# Patient Record
Sex: Female | Born: 1950 | Race: White | Hispanic: No | Marital: Married | Smoking: Never smoker
Health system: Southern US, Community
[De-identification: ages and names within clinical notes are randomized; demographics above are authoritative.]

## PROBLEM LIST (undated history)

## (undated) DIAGNOSIS — F329 Major depressive disorder, single episode, unspecified: Secondary | ICD-10-CM

## (undated) DIAGNOSIS — I10 Essential (primary) hypertension: Secondary | ICD-10-CM

## (undated) DIAGNOSIS — E785 Hyperlipidemia, unspecified: Secondary | ICD-10-CM

## (undated) DIAGNOSIS — G35 Multiple sclerosis: Secondary | ICD-10-CM

## (undated) DIAGNOSIS — R32 Unspecified urinary incontinence: Secondary | ICD-10-CM

## (undated) DIAGNOSIS — F32A Depression, unspecified: Secondary | ICD-10-CM

## (undated) HISTORY — DX: Essential (primary) hypertension: I10

## (undated) HISTORY — DX: Unspecified urinary incontinence: R32

## (undated) HISTORY — DX: Depression, unspecified: F32.A

## (undated) HISTORY — DX: Major depressive disorder, single episode, unspecified: F32.9

## (undated) HISTORY — DX: Multiple sclerosis: G35

## (undated) HISTORY — DX: Hyperlipidemia, unspecified: E78.5

## (undated) HISTORY — PX: APPENDECTOMY: SHX54

---

## 1979-06-24 DIAGNOSIS — G35 Multiple sclerosis: Secondary | ICD-10-CM

## 1979-06-24 HISTORY — DX: Multiple sclerosis: G35

## 2007-07-08 ENCOUNTER — Emergency Department: Payer: Self-pay | Admitting: Internal Medicine

## 2009-06-19 ENCOUNTER — Ambulatory Visit: Payer: Self-pay | Admitting: Neurology

## 2009-12-27 ENCOUNTER — Encounter: Payer: Self-pay | Admitting: Neurology

## 2010-01-21 ENCOUNTER — Encounter: Payer: Self-pay | Admitting: Neurology

## 2010-02-07 ENCOUNTER — Ambulatory Visit: Payer: Self-pay | Admitting: Internal Medicine

## 2010-11-08 ENCOUNTER — Ambulatory Visit: Payer: Self-pay | Admitting: Gastroenterology

## 2010-12-09 ENCOUNTER — Ambulatory Visit: Payer: Self-pay | Admitting: Neurology

## 2011-12-23 ENCOUNTER — Encounter: Payer: Self-pay | Admitting: Internal Medicine

## 2011-12-23 ENCOUNTER — Ambulatory Visit (INDEPENDENT_AMBULATORY_CARE_PROVIDER_SITE_OTHER): Payer: Medicare Other | Admitting: Internal Medicine

## 2011-12-23 VITALS — BP 132/80 | HR 110 | Temp 98.2°F

## 2011-12-23 DIAGNOSIS — F32A Depression, unspecified: Secondary | ICD-10-CM | POA: Insufficient documentation

## 2011-12-23 DIAGNOSIS — F329 Major depressive disorder, single episode, unspecified: Secondary | ICD-10-CM

## 2011-12-23 DIAGNOSIS — G35 Multiple sclerosis: Secondary | ICD-10-CM | POA: Insufficient documentation

## 2011-12-23 MED ORDER — ARIPIPRAZOLE 2 MG PO TABS: 2.0000 mg | ORAL_TABLET | Freq: Every day | ORAL | Status: DC

## 2011-12-23 NOTE — Assessment & Plan Note (Signed)
Severe. Progressed to the point that patient is unable to perform any ADLs. Will set up home health evaluation and evaluation for placement. Patient will likely benefit from motorized scooter or wheelchair. Will set evaluation for this. Will get records on previous care from her primary care physician and neurologist.

## 2011-12-23 NOTE — Progress Notes (Signed)
Subjective:    Patient ID: Maria Parsons, female    DOB: [birth year], 61 y.o.   MRN: [id number]  HPI 61 year old female with history of multiple sclerosis and depression presents to establish care. She comes in with her husband today. He provides her history. He reports that she was first diagnosed with multiple sclerosis in 1981. 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 Gradually, over time her functional status has declined. 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 She needs assistance to sit. She requires 24 7 care and full assistance with all activities of daily living. She is incontinent of bowel and only partially continent of urine. Her husband has been caring for her while also working full-time. He has had caregivers in the home, but they do not currently have full-time care providers. Her husband reports that he needs assistance with her care. He also likely need some assistance with durable medical equipment such as equipment to help with transfers or with mobility such as a wheelchair. He has been considering placement in assisted care or skilled nursing facility. He brings in [facility] 2 forms in today. He reports that this is been a difficult decision for him and his children. His wife is tearful during this discussion. He reports that she avoids this discussion at home. She is unable to articulate any concerns today.  Outpatient Encounter Prescriptions as of 12/23/2011  Medication Sig Dispense Refill  . ARIPiprazole (ABILIFY) 2 MG tablet Take 1 tablet (2 mg total) by mouth daily.  90 tablet  3  . baclofen (LIORESAL) 10 MG tablet Take 10 mg by mouth 3 (three) times daily.      . enalapril (VASOTEC) 10 MG tablet Take two tablets by mouth every morning and one tablet at night      . Ergocalciferol (VITAMIN D2) 2000 UNITS TABS Take 1 tablet by mouth daily.      Marland Kitchen gabapentin (NEURONTIN) 300 MG capsule Take 300 mg by mouth 3 (three) times daily.      Marland Kitchen  loperamide (IMODIUM) 2 MG capsule Take 2 mg by mouth as needed.      . nystatin-triamcinolone (MYCOLOG II) cream Apply 1 application topically 2 (two) times daily.      . sertraline (ZOLOFT) 100 MG tablet Take 2 mg by mouth daily.      . traMADol (ULTRAM) 50 MG tablet Take 50 mg by mouth 2 (two) times daily.      Marland Kitchen DISCONTD: ARIPiprazole (ABILIFY) 2 MG tablet Take 2 mg by mouth daily.       BP 132/80  Pulse 110  Temp 98.2 F (36.8 C) (Oral)  SpO2 95%  Review of Systems  Unable to perform ROS All information obtained from husband, denies recent fever, chills, change in bowel habits. Pt is frequently tearful.     Objective:   Physical Exam  Constitutional: She is oriented to person, place, and time. She appears well-developed and well-nourished. No distress.  HENT:  Head: Normocephalic and atraumatic.  Right Ear: External ear normal.  Left Ear: External ear normal.  Nose: Nose normal.  Mouth/Throat: Oropharynx is clear and moist.  Eyes: Conjunctivae are normal. Pupils are equal, round, and reactive to light. Right eye exhibits no discharge. Left eye exhibits no discharge. No scleral icterus.  Neck: Normal range of motion. Neck supple. No tracheal deviation present. No thyromegaly present.  Cardiovascular: Normal rate, regular rhythm, normal heart sounds and intact distal pulses.  Exam reveals no  gallop and no friction rub.   No murmur heard. Pulmonary/Chest: Effort normal and breath sounds normal. No respiratory distress. She has no wheezes. She has no rales. She exhibits no tenderness.  Abdominal: Soft. Bowel sounds are normal. She exhibits no distension. There is no tenderness.  Musculoskeletal: She exhibits no edema and no tenderness.  Lymphadenopathy:    She has no cervical adenopathy.  Neurological: She is alert and oriented to person, place, and time. She displays atrophy and abnormal reflex. A sensory deficit is present. She exhibits abnormal muscle tone (flacidd LE).  Coordination and gait abnormal.  Skin: Skin is warm and dry. No rash noted. She is not diaphoretic. No erythema. No pallor.  Psychiatric: Her behavior is normal. Judgment and thought content normal. Her speech is slurred. She exhibits a depressed mood.          Assessment & Plan:

## 2011-12-23 NOTE — Assessment & Plan Note (Signed)
Patient is tearful today, however has been reports symptoms have been fairly well-controlled on Zoloft and Abilify. We'll continue these medications.

## 2011-12-25 ENCOUNTER — Encounter: Payer: Self-pay | Admitting: Internal Medicine

## 2011-12-26 ENCOUNTER — Other Ambulatory Visit: Payer: Self-pay | Admitting: *Deleted

## 2011-12-26 ENCOUNTER — Encounter: Payer: Self-pay | Admitting: Internal Medicine

## 2011-12-26 DIAGNOSIS — F329 Major depressive disorder, single episode, unspecified: Secondary | ICD-10-CM

## 2011-12-26 MED ORDER — ARIPIPRAZOLE 2 MG PO TABS: 2.0000 mg | ORAL_TABLET | Freq: Every day | ORAL | Status: DC

## 2011-12-26 MED ORDER — SERTRALINE HCL 100 MG PO TABS: 2.0000 mg | ORAL_TABLET | Freq: Every day | ORAL | Status: DC

## 2011-12-27 MED ORDER — TRAMADOL HCL 50 MG PO TABS: 50.0000 mg | ORAL_TABLET | Freq: Two times a day (BID) | ORAL | Status: AC

## 2011-12-27 MED ORDER — SERTRALINE HCL 100 MG PO TABS: 200.0000 mg | ORAL_TABLET | Freq: Every morning | ORAL | Status: AC

## 2011-12-27 MED ORDER — ARIPIPRAZOLE 2 MG PO TABS: 2.0000 mg | ORAL_TABLET | Freq: Every day | ORAL | Status: DC

## 2011-12-29 NOTE — Telephone Encounter (Signed)
Rx(s) were sent to pharmacy electronically. 

## 2011-12-30 ENCOUNTER — Telehealth: Payer: Self-pay | Admitting: *Deleted

## 2011-12-30 NOTE — Telephone Encounter (Signed)
Cari called to let Dr. Dan Humphreys know that Mrs. Boese has insurance that is out of network with [employer].  She recommended [facility].

## 2011-12-30 NOTE — Telephone Encounter (Signed)
Called and spoke with receptionist at [employer] [facility] and was advised that it doesn't sound like insurance will cover the services that she needs.  They will only cover short term services.  She stated that since patients spouse works full time then insurance will likely not cover services.  Please advise.

## 2011-12-30 NOTE — Telephone Encounter (Signed)
I would let her husband know and see if he wants [place] to assist with placement. I had filled out forms, but not sure if there are other things we can do to help.

## 2011-12-30 NOTE — Telephone Encounter (Signed)
Fine to set up [facility]

## 2011-12-31 NOTE — Telephone Encounter (Signed)
Left message on cell phone voicemail for Maria Parsons to return call.

## 2012-01-01 ENCOUNTER — Other Ambulatory Visit: Payer: Self-pay | Admitting: *Deleted

## 2012-01-01 MED ORDER — BACLOFEN 10 MG PO TABS: 10.0000 mg | ORAL_TABLET | Freq: Three times a day (TID) | ORAL | Status: DC

## 2012-01-01 NOTE — Telephone Encounter (Addendum)
Advised patients spouse via message left on machine at home that Rx was sent to pharmacy.

## 2012-01-01 NOTE — Telephone Encounter (Signed)
Left message on machine at home advising spouse as instructed, asked him to call back if we could be of any further assistance.

## 2012-01-05 ENCOUNTER — Other Ambulatory Visit: Payer: Self-pay | Admitting: *Deleted

## 2012-01-05 MED ORDER — NYSTATIN-TRIAMCINOLONE 100000-0.1 UNIT/GM-% EX CREA: 1.0000 "application " | TOPICAL_CREAM | Freq: Two times a day (BID) | CUTANEOUS | Status: DC

## 2012-01-07 ENCOUNTER — Telehealth: Payer: Self-pay | Admitting: *Deleted

## 2012-01-07 NOTE — Telephone Encounter (Signed)
Patients spouse Gery Pray) called to let Dr. Dan Humphreys know that social services changed computer systems and will need her to fill out a new FL2 form.  They will mail it to Dr. Dan Humphreys here at the office.

## 2012-01-19 ENCOUNTER — Other Ambulatory Visit: Payer: Self-pay | Admitting: *Deleted

## 2012-01-19 MED ORDER — ENALAPRIL MALEATE 10 MG PO TABS: ORAL_TABLET | ORAL | Status: AC

## 2012-01-26 ENCOUNTER — Other Ambulatory Visit: Payer: Self-pay | Admitting: *Deleted

## 2012-01-26 DIAGNOSIS — F329 Major depressive disorder, single episode, unspecified: Secondary | ICD-10-CM

## 2012-01-26 MED ORDER — ARIPIPRAZOLE 2 MG PO TABS: 2.0000 mg | ORAL_TABLET | Freq: Every day | ORAL | Status: DC

## 2012-01-26 NOTE — Telephone Encounter (Signed)
Message copied by Jobie Quaker on Mon Jan 26, 2012  2:55 PM ------      Message from: Crissie Figures K      Created: Mon Jan 26, 2012  2:30 PM       Lonia Skinner calling [phone] Calling to speak to Rn about getting refill on wife Medication abilify

## 2012-01-28 ENCOUNTER — Ambulatory Visit: Payer: Medicare Other | Admitting: Internal Medicine

## 2012-02-02 ENCOUNTER — Telehealth: Payer: Self-pay | Admitting: *Deleted

## 2012-02-02 ENCOUNTER — Encounter: Payer: Self-pay | Admitting: Internal Medicine

## 2012-02-02 NOTE — Telephone Encounter (Signed)
Fine to continue Vit D.  Yes, I will write a letter excusing from [employer].

## 2012-02-02 NOTE — Telephone Encounter (Signed)
Patients spouse called with several questions: 1) Patients spouse would like Dr. Dan Humphreys to write a letter excusing patient from jury duty and fax the letter to him at [phone] 2) Do you want patient to continue to take Vitamin D2?  If so she will need a refill.

## 2012-02-03 MED ORDER — GABAPENTIN 300 MG PO CAPS: 300.0000 mg | ORAL_CAPSULE | Freq: Three times a day (TID) | ORAL | Status: AC

## 2012-02-03 MED ORDER — BACLOFEN 10 MG PO TABS: 10.0000 mg | ORAL_TABLET | Freq: Three times a day (TID) | ORAL | Status: AC

## 2012-02-03 MED ORDER — VITAMIN D2 50 MCG (2000 UT) PO TABS: 1.0000 | ORAL_TABLET | Freq: Every day | ORAL | Status: AC

## 2012-02-03 NOTE — Telephone Encounter (Signed)
Left message on voicemail at work advising spouse that Rx's have been sent to pharmacy and letter excusing patient from jury duty was faxed to number below.

## 2012-02-17 ENCOUNTER — Encounter: Payer: Self-pay | Admitting: Internal Medicine

## 2012-02-18 ENCOUNTER — Encounter: Payer: Self-pay | Admitting: Internal Medicine

## 2012-02-18 ENCOUNTER — Ambulatory Visit (INDEPENDENT_AMBULATORY_CARE_PROVIDER_SITE_OTHER): Payer: Medicare Other | Admitting: Internal Medicine

## 2012-02-18 VITALS — BP 122/82 | HR 130 | Temp 98.7°F

## 2012-02-18 DIAGNOSIS — G35 Multiple sclerosis: Secondary | ICD-10-CM

## 2012-02-18 DIAGNOSIS — L89159 Pressure ulcer of sacral region, unspecified stage: Secondary | ICD-10-CM | POA: Insufficient documentation

## 2012-02-18 DIAGNOSIS — F329 Major depressive disorder, single episode, unspecified: Secondary | ICD-10-CM

## 2012-02-18 DIAGNOSIS — L8990 Pressure ulcer of unspecified site, unspecified stage: Secondary | ICD-10-CM

## 2012-02-18 DIAGNOSIS — L89109 Pressure ulcer of unspecified part of back, unspecified stage: Secondary | ICD-10-CM

## 2012-02-18 NOTE — Progress Notes (Signed)
Subjective:    Patient ID: Maria Parsons, female    DOB: 05-01-1951, 61 y.o.   MRN: [id number]  HPI 61 year old female with history of multiple sclerosis, depression, and chronic pain presents for followup. She has had significant decline since her last visit. Her husband reports that she is frequently slumped over in her chair and is frequently tearful. She repeatedly says that she would rather die than live this way. She refuses repositioning at home and ultimately has developed a sacral decubitus ulcer because of this. Husband has tried applying topical nystatin ointment with minimal improvement. Patient denies any pain at the site. Husband has been working with an attorney to try to establish financial way for patient to be placed in a nursing facility. At her last visit, we had tried to set up home health assistance however home health agency was out of network. Husband states that he would like to try another company at this time.  Outpatient Encounter Prescriptions as of 02/18/2012  Medication Sig Dispense Refill  . ARIPiprazole (ABILIFY) 2 MG tablet Take 1 tablet (2 mg total) by mouth daily.  30 tablet  5  . baclofen (LIORESAL) 10 MG tablet Take 1 tablet (10 mg total) by mouth 3 (three) times daily.  270 each  3  . Dimethyl Fumarate (TECFIDERA) 240 MG CPDR Take 1 tablet by mouth 2 (two) times daily.      . enalapril (VASOTEC) 10 MG tablet Take two tablets by mouth every morning and one tablet at night  90 tablet  6  . Ergocalciferol (VITAMIN D2) 2000 UNITS TABS Take 1 tablet by mouth daily.  90 tablet  3  . gabapentin (NEURONTIN) 300 MG capsule Take 1 capsule (300 mg total) by mouth 3 (three) times daily.  270 capsule  3  . loperamide (IMODIUM) 2 MG capsule Take 2 mg by mouth as needed.      . nystatin-triamcinolone (MYCOLOG II) cream Apply 1 application topically 2 (two) times daily.  30 g  6  . sertraline (ZOLOFT) 100 MG tablet Take 2 tablets (200 mg total) by mouth every morning.  60  tablet  5  . traMADol (ULTRAM) 50 MG tablet Take 1 tablet (50 mg total) by mouth 2 (two) times daily.  60 tablet  5   BP 122/82  Pulse 130  Temp 98.7 F (37.1 C) (Oral)  SpO2 91%  Review of Systems  Constitutional: Negative for fever, chills, appetite change and fatigue.  Respiratory: Negative for cough and shortness of breath.   Gastrointestinal: Negative for abdominal pain.  Musculoskeletal: Positive for myalgias, arthralgias and gait problem.  Skin: Positive for wound.  Hematological: Negative for adenopathy. Does not bruise/bleed easily.  Psychiatric/Behavioral: Positive for suicidal ideas and dysphoric mood. Negative for disturbed wake/sleep cycle. The patient is not nervous/anxious.        Objective:   Physical Exam  Constitutional: She appears well-developed and well-nourished. She has a sickly appearance. No distress.       Patient slumped over in wheelchair, leaning forward, unable to sit upright. Patient crying throughout exam. Denies pain.  HENT:  Head: Normocephalic and atraumatic.  Cardiovascular: Normal rate, regular rhythm, normal heart sounds and intact distal pulses.  Exam reveals no gallop and no friction rub.   No murmur heard. Pulmonary/Chest: Effort normal and breath sounds normal. No respiratory distress. She has no wheezes. She has no rales.  Neurological: She is alert. She exhibits abnormal muscle tone. Coordination abnormal.  Skin: Skin is warm. She is  not diaphoretic.  Psychiatric: Her speech is slurred. She is withdrawn. She exhibits a depressed mood. She expresses suicidal ideation.          Assessment & Plan:

## 2012-02-18 NOTE — Assessment & Plan Note (Signed)
Marked progression since last visit. Patient would likely benefit from round-the-clock skilled care. Discussed this with her husband today. Will make a repeat referral to home health for further assistance. We also discussed the potential of admitting patient to the hospital for help with ongoing medical issues and ultimately with placement. Husband would prefer to hold off on this at this time.

## 2012-02-18 NOTE — Assessment & Plan Note (Signed)
Patient with reported sacral decubitus ulcer. We are not able to reposition her in clinic to visualize this because of patient discomfort with movement and lack of proper equipment. Will set up evaluation at the [facility] healing Center. As above, patient would benefit from round-the-clock care with frequent repositioning to help prevent wounds. Encouraged her husband to consider inpatient stay and/or placement in nursing facility.

## 2012-02-18 NOTE — Assessment & Plan Note (Addendum)
Patient is tearful today with suicidal ideation. Discussed with husband potential of increasing dose of Zoloft and Abilify. However, he has not seen significant change with use of these medications. Will leave medicines as they are for now. Will try to set up social work evaluation in the home. Pt has 24/7 caregiver for monitoring.

## 2012-02-20 ENCOUNTER — Telehealth: Payer: Self-pay | Admitting: *Deleted
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
## 2012-02-24 NOTE — Telephone Encounter (Signed)
Left detailed message on voicemail for Maria Parsons advising as instructed as stated below.

## 2012-02-25 ENCOUNTER — Encounter: Payer: Self-pay | Admitting: Cardiothoracic Surgery

## 2012-02-25 ENCOUNTER — Encounter: Payer: Self-pay | Admitting: Nurse Practitioner

## 2012-03-02 ENCOUNTER — Other Ambulatory Visit: Payer: Self-pay | Admitting: *Deleted

## 2012-03-02 DIAGNOSIS — F329 Major depressive disorder, single episode, unspecified: Secondary | ICD-10-CM

## 2012-03-02 MED ORDER — NYSTATIN-TRIAMCINOLONE 100000-0.1 UNIT/GM-% EX CREA: 1.0000 "application " | TOPICAL_CREAM | Freq: Two times a day (BID) | CUTANEOUS | Status: AC

## 2012-03-02 MED ORDER — ARIPIPRAZOLE 2 MG PO TABS: 2.0000 mg | ORAL_TABLET | Freq: Every day | ORAL | Status: AC

## 2012-03-09 ENCOUNTER — Telehealth: Payer: Self-pay | Admitting: *Deleted

## 2012-03-09 MED ORDER — CEPHALEXIN 500 MG PO TABS: 500.0000 mg | ORAL_TABLET | Freq: Four times a day (QID) | ORAL | Status: AC

## 2012-03-09 NOTE — Telephone Encounter (Signed)
Plsae ask the hospice nurse to obtain a culture of the wound if possible.  I will call in keflex to her pharmacy .  The patient sounds like she may be septic or dehydrated.  Is shea Full Code or is she  DNR? If she is Full code  they may need to take her to the ER

## 2012-03-09 NOTE — Telephone Encounter (Signed)
I spoke with Maria Parsons, she will obtain the culture tomorrow.  I advised of the Rx, and she stated patient is DNR.

## 2012-03-09 NOTE — Telephone Encounter (Signed)
Marchelle Folks w/Hospice reporting that patient is being treated for MS & Stage IV decubitus ulcer and upon her visit today pt has fever of 99.6 w/decreased level of consciousness [able to move head to answer], elevated HR [159] & BP [110/64]; reports wound appears to be worse in appearance, drainage & odor since being changed yesterday/SLS Request for for ABX Rx, please advise.

## 2012-03-12 ENCOUNTER — Telehealth: Payer: Self-pay | Admitting: Internal Medicine

## 2012-03-12 NOTE — Telephone Encounter (Signed)
Urine culture showed E. Coli. Has this been treated?

## 2012-03-12 NOTE — Telephone Encounter (Signed)
Spoke with patients spouse Gery Pray and he stated that something was sent in for her but he wasn't sure if it was for the urine bacteria.  I called [facility] and got the after hours answering service and Dr. Dan Humphreys advised that I wait and call back on Monday.

## 2012-03-13 ENCOUNTER — Encounter: Payer: Self-pay | Admitting: Internal Medicine

## 2012-03-15 NOTE — Telephone Encounter (Signed)
Wound culture results given to Dr. Dan Humphreys.

## 2012-03-15 NOTE — Telephone Encounter (Signed)
Culture shows bacteria that should be sensitive to Keflex.

## 2012-03-15 NOTE — Telephone Encounter (Signed)
Patients spouse advised as instructed via telephone. 

## 2012-03-17 ENCOUNTER — Inpatient Hospital Stay: Payer: Self-pay | Admitting: Internal Medicine

## 2012-03-17 LAB — CBC
HCT: 27 % — ABNORMAL LOW (ref 35.0–47.0)
HGB: 8.8 g/dL — ABNORMAL LOW (ref 12.0–16.0)
Platelet: 412 10*3/uL (ref 150–440)
RBC: 3.17 10*6/uL — ABNORMAL LOW (ref 3.80–5.20)
WBC: 11.3 10*3/uL — ABNORMAL HIGH (ref 3.6–11.0)

## 2012-03-17 LAB — FERRITIN: Ferritin (ARMC): 219 ng/mL (ref 8–388)

## 2012-03-17 LAB — BASIC METABOLIC PANEL
Anion Gap: 12 (ref 7–16)
BUN: 26 mg/dL — ABNORMAL HIGH (ref 7–18)
Chloride: 99 mmol/L (ref 98–107)
Creatinine: 0.33 mg/dL — ABNORMAL LOW (ref 0.60–1.30)
EGFR (Non-African Amer.): >60
Potassium: 4.9 mmol/L (ref 3.5–5.1)
Sodium: 132 mmol/L — ABNORMAL LOW (ref 136–145)

## 2012-03-17 LAB — IRON AND TIBC
Iron Bind.Cap.(Total): 239 ug/dL — ABNORMAL LOW (ref 250–450)
Unbound Iron-Bind.Cap.: 209 ug/dL

## 2012-03-17 LAB — TROPONIN I
Troponin-I: <0.02 ng/mL
Troponin-I: <0.02 ng/mL

## 2012-03-17 LAB — URINALYSIS, COMPLETE
Bilirubin,UR: NEGATIVE
Ketone: NEGATIVE
Nitrite: NEGATIVE
Ph: 7 (ref 4.5–8.0)
RBC,UR: 24 /HPF (ref 0–5)
Specific Gravity: 1.02 (ref 1.003–1.030)
Squamous Epithelial: 1

## 2012-03-17 LAB — CK-MB: CK-MB: 2.2 ng/mL (ref 0.5–3.6)

## 2012-03-18 ENCOUNTER — Ambulatory Visit: Payer: Self-pay | Admitting: Hematology and Oncology

## 2012-03-18 LAB — CBC WITH DIFFERENTIAL/PLATELET
Basophil #: 0 10*3/uL (ref 0.0–0.1)
Basophil %: 0.4 %
Eosinophil %: 7.1 %
HGB: 7.6 g/dL — ABNORMAL LOW (ref 12.0–16.0)
Lymphocyte #: 0.9 10*3/uL — ABNORMAL LOW (ref 1.0–3.6)
Lymphocyte %: 13.1 %
MCHC: 33.6 g/dL (ref 32.0–36.0)
MCV: 85 fL (ref 80–100)
Monocyte %: 5.7 %
Neutrophil #: 4.9 10*3/uL (ref 1.4–6.5)
Neutrophil %: 73.7 %
RBC: 2.65 10*6/uL — ABNORMAL LOW (ref 3.80–5.20)
RDW: 15.5 % — ABNORMAL HIGH (ref 11.5–14.5)
WBC: 6.7 10*3/uL (ref 3.6–11.0)

## 2012-03-18 LAB — COMPREHENSIVE METABOLIC PANEL
Alkaline Phosphatase: 81 U/L (ref 50–136)
Bilirubin,Total: 0.2 mg/dL (ref 0.2–1.0)
Chloride: 104 mmol/L (ref 98–107)
Co2: 23 mmol/L (ref 21–32)
Creatinine: 0.36 mg/dL — ABNORMAL LOW (ref 0.60–1.30)
EGFR (African American): >60
EGFR (Non-African Amer.): >60
Osmolality: 273 (ref 275–301)
Sodium: 137 mmol/L (ref 136–145)

## 2012-03-18 LAB — URINE CULTURE

## 2012-03-19 LAB — HEMOGLOBIN: HGB: 7.5 g/dL — ABNORMAL LOW (ref 12.0–16.0)

## 2012-03-20 LAB — VANCOMYCIN, TROUGH: Vancomycin, Trough: 10 ug/mL (ref 10–20)

## 2012-03-21 LAB — CBC WITH DIFFERENTIAL/PLATELET
Eosinophil #: 0.2 10*3/uL (ref 0.0–0.7)
HCT: 29.4 % — ABNORMAL LOW (ref 35.0–47.0)
Lymphocyte #: 0.8 10*3/uL — ABNORMAL LOW (ref 1.0–3.6)
MCH: 28.5 pg (ref 26.0–34.0)
MCV: 84 fL (ref 80–100)
Monocyte #: 0.4 x10 3/mm (ref 0.2–0.9)
Monocyte %: 5.4 %
Neutrophil %: 78.8 %
Platelet: 420 10*3/uL (ref 150–440)
RBC: 3.48 10*6/uL — ABNORMAL LOW (ref 3.80–5.20)
RDW: 15 % — ABNORMAL HIGH (ref 11.5–14.5)
WBC: 7.2 10*3/uL (ref 3.6–11.0)

## 2012-03-22 LAB — CREATININE, SERUM
Creatinine: 0.35 mg/dL — ABNORMAL LOW (ref 0.60–1.30)
EGFR (African American): >60

## 2012-03-22 LAB — VANCOMYCIN, TROUGH: Vancomycin, Trough: 13 ug/mL (ref 10–20)

## 2012-03-23 ENCOUNTER — Ambulatory Visit: Payer: Self-pay | Admitting: Hematology and Oncology

## 2012-03-23 LAB — BASIC METABOLIC PANEL
Calcium, Total: 8.4 mg/dL — ABNORMAL LOW (ref 8.5–10.1)
Chloride: 105 mmol/L (ref 98–107)
Co2: 25 mmol/L (ref 21–32)
Creatinine: 0.37 mg/dL — ABNORMAL LOW (ref 0.60–1.30)
Glucose: 83 mg/dL (ref 65–99)
Sodium: 143 mmol/L (ref 136–145)

## 2012-03-23 LAB — CBC WITH DIFFERENTIAL/PLATELET
Basophil %: 0.3 %
Eosinophil #: 0.1 10*3/uL (ref 0.0–0.7)
HCT: 29.5 % — ABNORMAL LOW (ref 35.0–47.0)
HGB: 9.8 g/dL — ABNORMAL LOW (ref 12.0–16.0)
Lymphocyte #: 1.5 10*3/uL (ref 1.0–3.6)
MCH: 27.9 pg (ref 26.0–34.0)
MCHC: 33.2 g/dL (ref 32.0–36.0)
MCV: 84 fL (ref 80–100)
Monocyte #: 0.4 x10 3/mm (ref 0.2–0.9)
Neutrophil #: 12.7 10*3/uL — ABNORMAL HIGH (ref 1.4–6.5)
Neutrophil %: 86.5 %
Platelet: 399 10*3/uL (ref 150–440)

## 2012-03-23 LAB — CULTURE, BLOOD (SINGLE)

## 2012-03-24 LAB — BASIC METABOLIC PANEL
Anion Gap: 9 (ref 7–16)
BUN: 7 mg/dL (ref 7–18)
Chloride: 109 mmol/L — ABNORMAL HIGH (ref 98–107)
EGFR (Non-African Amer.): >60
Glucose: 66 mg/dL (ref 65–99)
Osmolality: 279 (ref 275–301)
Potassium: 3.6 mmol/L (ref 3.5–5.1)
Sodium: 142 mmol/L (ref 136–145)

## 2012-03-31 ENCOUNTER — Encounter: Payer: Self-pay | Admitting: Internal Medicine

## 2012-04-23 ENCOUNTER — Ambulatory Visit: Payer: Self-pay | Admitting: Hematology and Oncology

## 2012-04-23 DEATH — deceased

## 2012-08-07 ENCOUNTER — Other Ambulatory Visit: Payer: Self-pay

## 2013-02-10 IMAGING — CT CT CHEST W/ CM
1 series · 15 of 34 positions shown, 19 images · IV contrast (APPLIED)
Comparison: none

REASON FOR EXAM: tachycardia, hypoxia. normal chest xray
COMMENTS:

[Series 5: soft tissue · axial · 0.64mm/px · z∈[-120,+124]mm · 15 of 97 slices shown, 19 images]
[im 8/97  mediastinal]
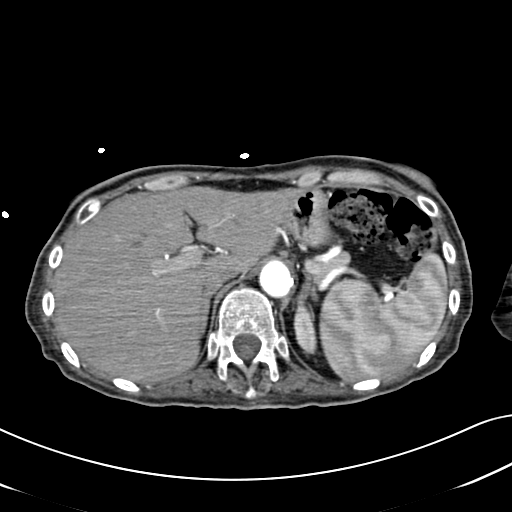
[im 8/97  lung]
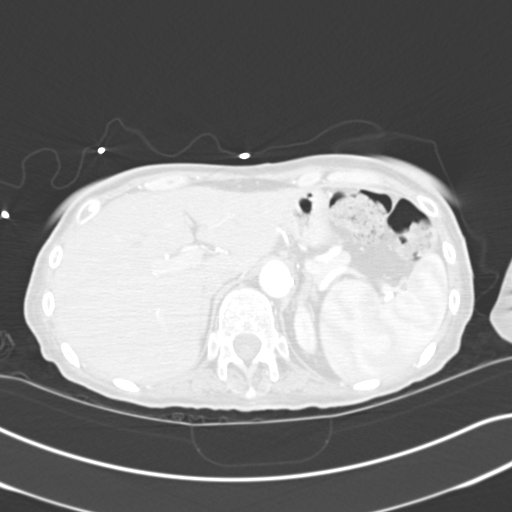
[im 15/97  lung]
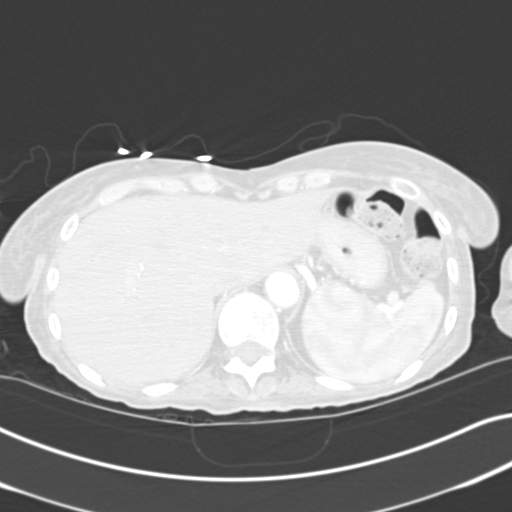
[im 20/97  lung]
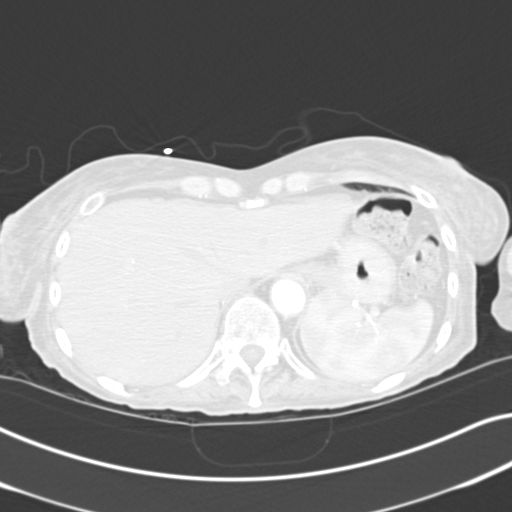
[im 25/97  lung]
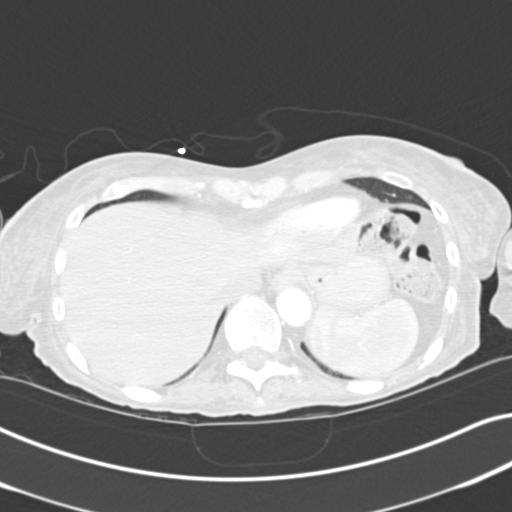
[im 33/97  mediastinal]
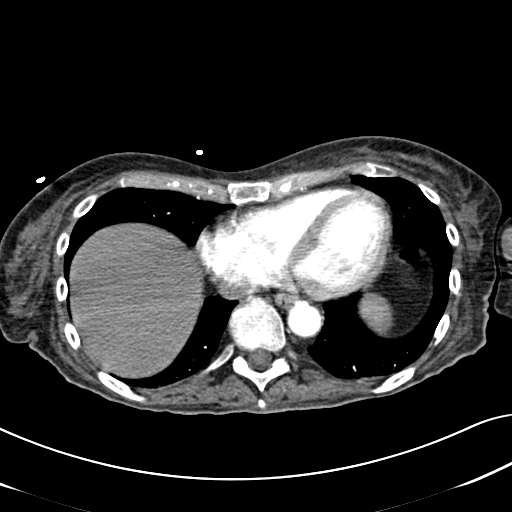
[im 33/97  lung]
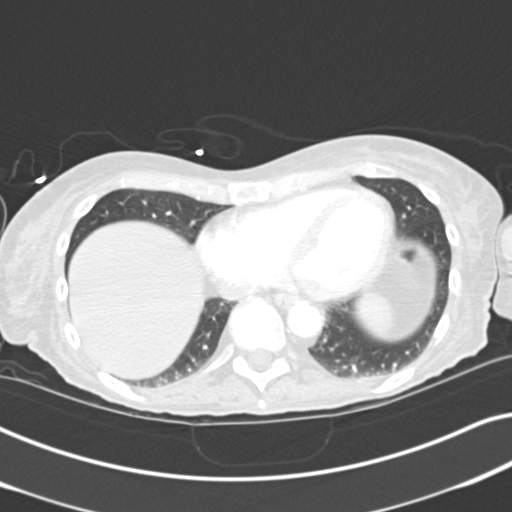
[im 39/97  lung]
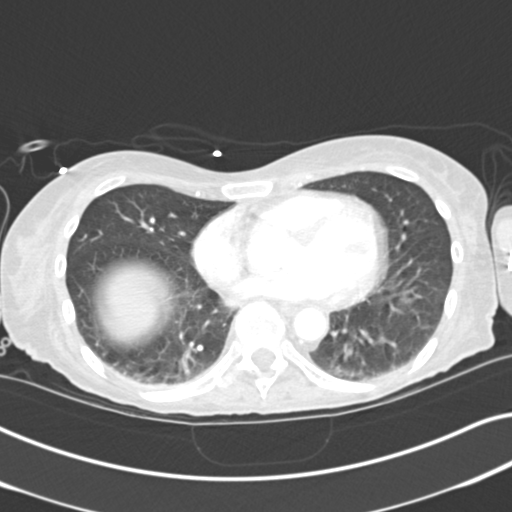
[im 43/97  lung]
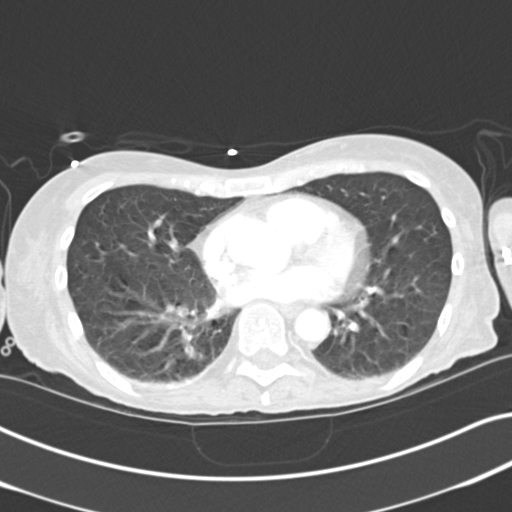
[im 50/97  lung]
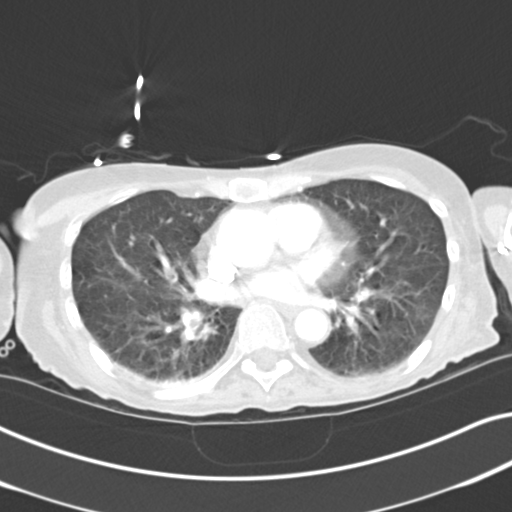
[im 54/97  mediastinal]
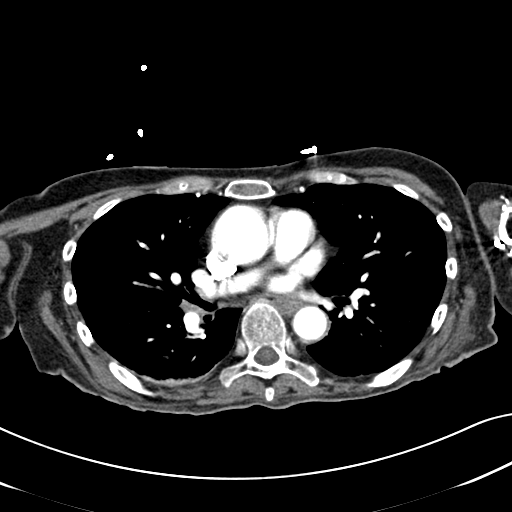
[im 54/97  lung]
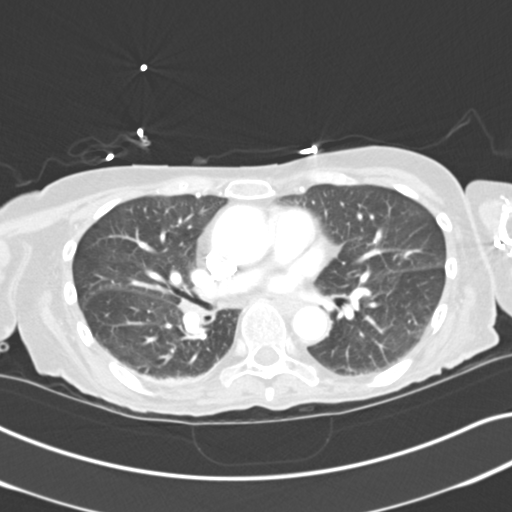
[im 58/97  lung]
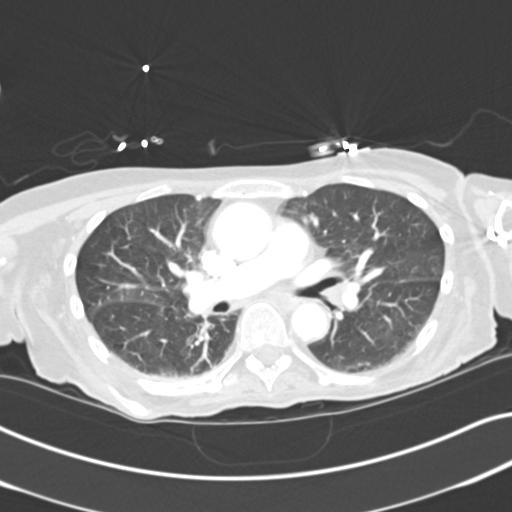
[im 65/97  lung]
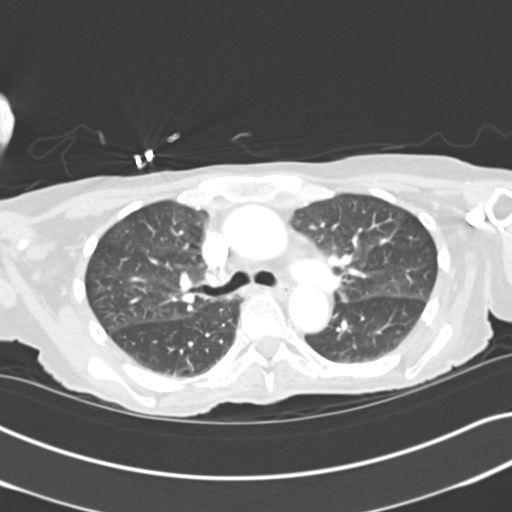
[im 72/97  lung]
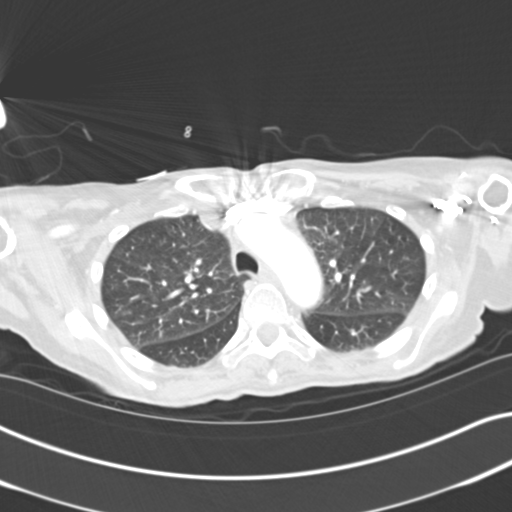
[im 77/97  mediastinal]
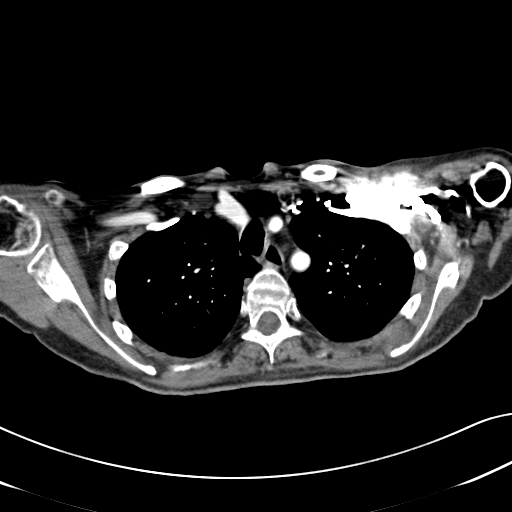
[im 77/97  lung]
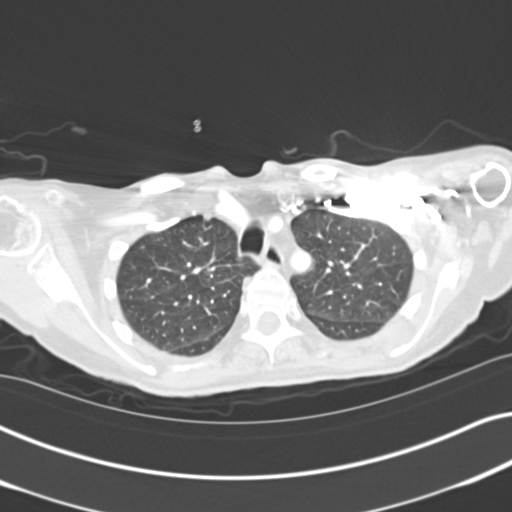
[im 82/97  lung]
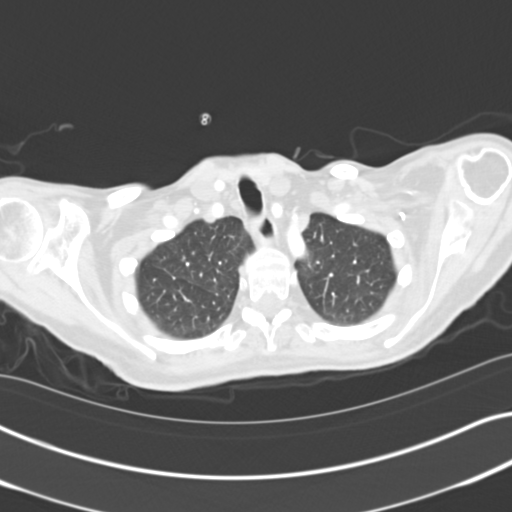
[im 89/97  lung]
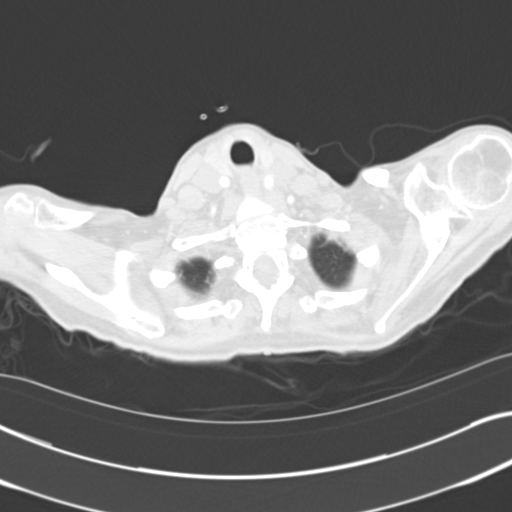

[15 of 34 positions shown; findings below may reference images not displayed]

PROCEDURE:     CT  - CT CHEST (FOR PE) W  - March 17, 2012  [DATE]

RESULT:     Chest CT is performed utilizing 75 mL of 7sovue-17C iodinated
intravenous contrast with images reconstructed at 3 mm slice thickness
utilizing a CT pulmonary angiogram technique. Multiplanar reconstructions
are evaluated with the [employer] software at the time of interpretation.
There is no similar previous study for comparison.

There is respiratory motion artifact on some of the lung window images.
There is hazy groundglass attenuation especially in the mid and lower lung
zones. Some areas of presumed of dependent atelectasis are noted
bilaterally. There is no definite bronchiectasis or definite focal
consolidation to suggest pneumonia. No definite mass is appreciated. The
thoracic aorta is normal in caliber. The heart is borderline enlarged. No
mediastinal or hilar mass or adenopathy is evident. The pulmonary arteries
show no definite filling defect. The respiratory motion artifact limits
resolution in the infrahilar region. The included thyroid lobes appear to
enhance homogeneously and symmetrically without enlargement or mass
demonstrated. Bony structures show some degenerative change.
IMPRESSION: 1. Cardiomegaly. No thoracic aortic dissection evident. There is some
limitation of the study because of artifact from motion.
2. No pulmonary embolism evident.
3. Some mild groundglass attenuation in the lungs with some respiratory
motion artifact and dependent atelectasis.
4. The included upper abdominal viscera appear to be grossly normal for the
early arterial phase of injection.

[REDACTED]

## 2013-02-16 IMAGING — CR DG CHEST 1V PORT
1 series · 1 of 1 positions shown · non-contrast
Comparison: none

REASON FOR EXAM: aspiration
COMMENTS:

[portable]
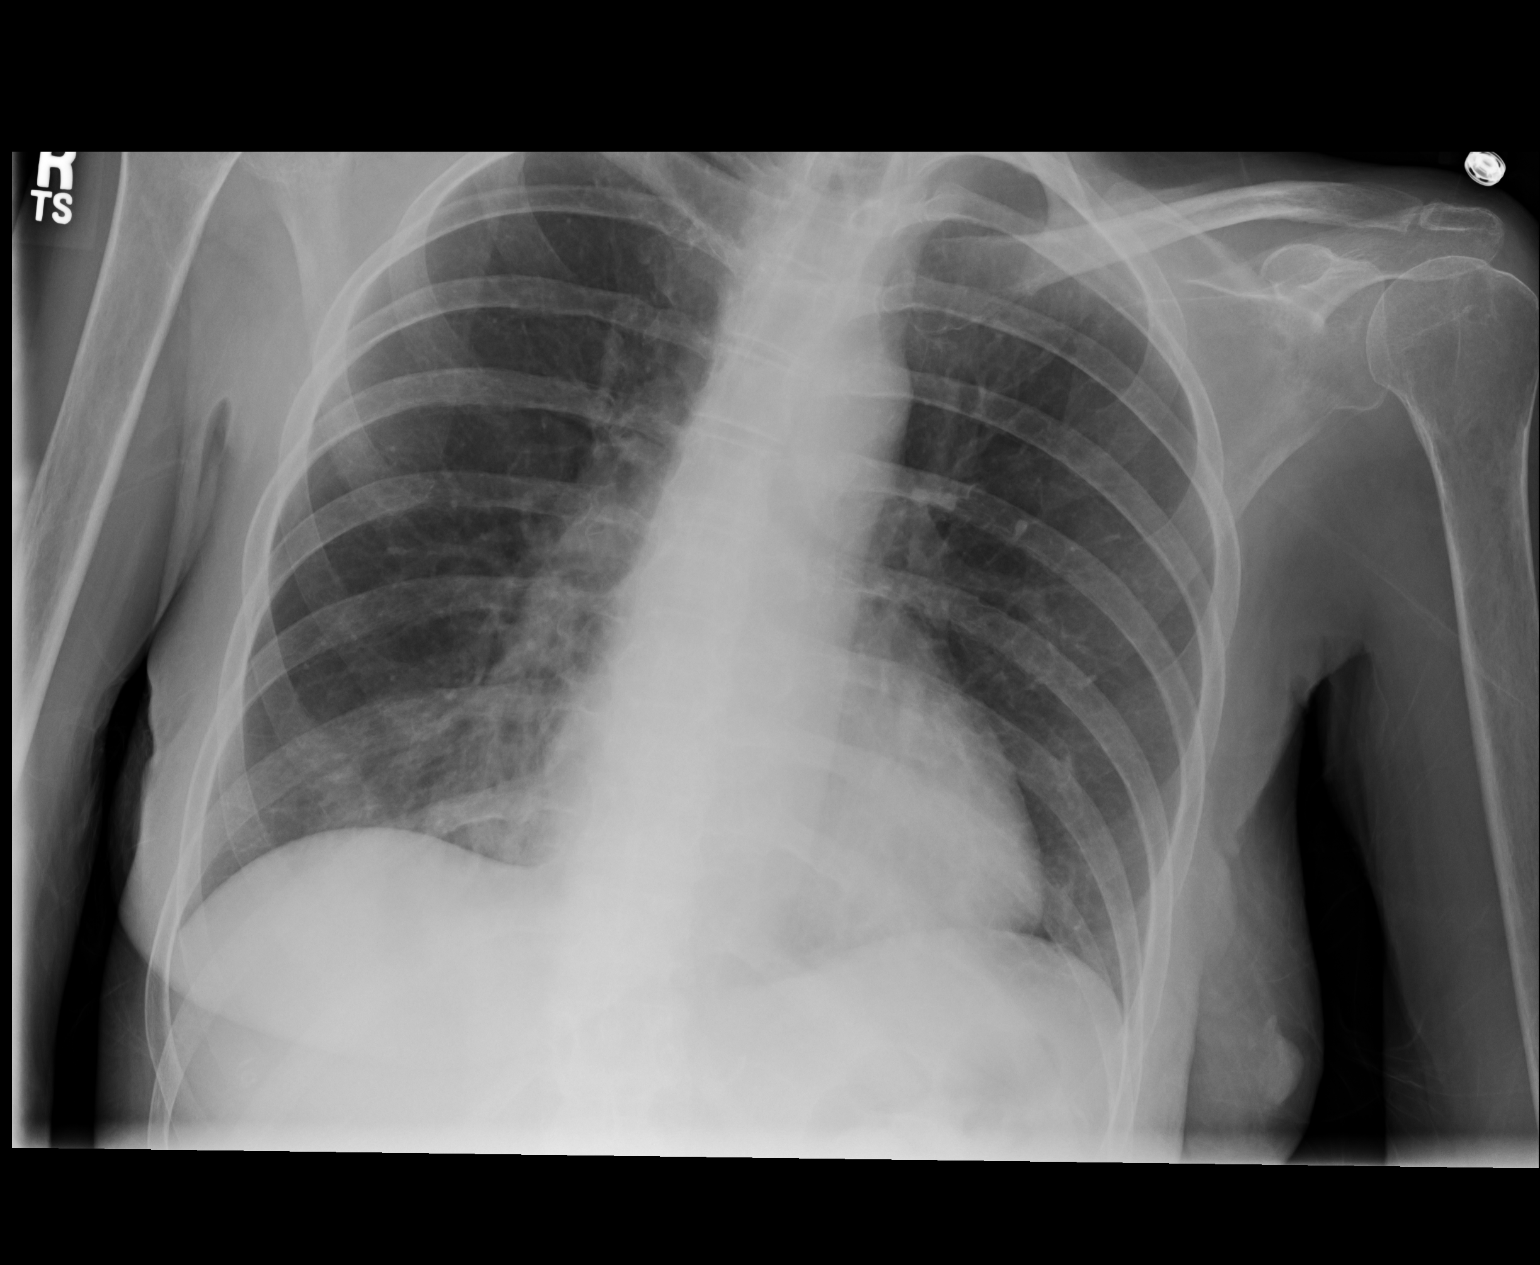

[1 of 1 positions shown; findings below may reference images not displayed]

PROCEDURE:     DXR - DXR PORTABLE CHEST SINGLE VIEW  - March 23, 2012  [DATE]

RESULT:     Comparison is made to the study March 22, 2012.

The lungs are adequately inflated. There are coarse lung markings in the
infrahilar region on the right which are more conspicuous than on
yesterday's study. The cardiac silhouette is normal in size. The pulmonary
vascularity is not engorged. There is no pleural pleural effusion.
IMPRESSION: The findings suggest developing atelectasis in the right
infrahilar region medially. When the patient can tolerate the procedure, a
PA and lateral chest x-ray would be of value.

[REDACTED]

## 2013-04-28 ENCOUNTER — Other Ambulatory Visit: Payer: Self-pay

## 2014-10-10 NOTE — Consult Note (Signed)
PATIENT NAME:  Maria Parsons, Helane MR#:  [id number] DATE OF BIRTH:  1950/09/02  DATE OF CONSULTATION:  03/18/2012  REFERRING PHYSICIAN:  Prime Doc  CONSULTING PHYSICIAN:  Carmie Endalph L. Ely III, MD  PRIMARY CARE PHYSICIAN: Ronna PolioJennifer Walker, MD  CHIEF COMPLAINT: Decubitus ulcer.   BRIEF HISTORY: Ms. Donavan FoilBass is a 64 year old woman with multiple medical problems. She has a long-standing history of progressive multiple sclerosis and has had a chronic decubitus ulcer. She has been bedbound for last several months and has [facility] some time in an effort to treat this significant sacral decubitus. The remainder of her history and current illness is well outlined in her admission note and does not pertain to the surgical question. She lives at home and has a home care provider during the day and her husband takes care of her on nights and weekends. The son is present for the interview and provides some of the history.   REVIEW OF SYSTEMS: Not possible. Admitting review of systems was reviewed.  PHYSICAL EXAMINATION:   GENERAL: She is lying in bed, uncomfortable, cooperative, with no obvious distress, but she does not communicate easily and appears frightened and unhappy with current state of affairs.   NECK: Supple without adenopathy. Trachea is midline.   CHEST: Clear with no wheezing and she appears to have respiratory insufficiency or increased work of breathing.   CARDIAC: No murmurs or gallops to my ear. She has no distal edema.   ABDOMEN: Benign. No organomegaly and she has no rebound, guarding, or abdominal tenderness. Looking at her pelvis and sacrum, she has a large 10 to 12 cm in diameter sacral ulcer down through the sacral fascia. There is no evidence of exposed bone or osteomyelitis, but there is fascia present. There is some necrotic skin, but obviously no significant infection. There is no surrounding cellulitis.   EXTREMITIES: Lower extremity exam reveals very cachectic  lower extremities with markedly diminished distal pulses.   ASSESSMENT AND RECOMMENDATIONS: I had a long talk with the son. This woman will require extensive surgical intervention in order to provide any hope of closing this ulcer. The current therapy has simply been temporizing and has not made any progress in actually closing the ulcer cavity. She would need debridement and wound VAC therapy and possible flap coverage depending on the progression with wound VAC therapy. She would need regular home health and office evaluations for wound VAC changes and possible multiple intraoperative wound vac changes. I talked to the son who will speak with his father and her husband and make a decision regarding degree of intervention. She is currently followed by hospice at home and perhaps we should continue the palliative dressing changes rather than pursue an aggressive surgical approach.  We will continue to follow this woman while she is in the hospital in an effort to adhere to the family's plan.  ____________________________ Carmie Endalph L. Ely III, MD rle:slb D: 03/18/2012 16:09:00 ET T: 03/19/2012 09:05:49 ET JOB#: [id number]  cc: Carmie Endalph L. Ely III, MD, <Dictator> Ginette PitmanJennifer A. Dan HumphreysWalker, MD Quentin OreALPH L ELY MD ELECTRONICALLY SIGNED 03/21/2012 16:26

## 2014-10-10 NOTE — Consult Note (Signed)
History of Present Illness:   Reason for Consult Anemia Consult for possible iv Iron therapy.    HPI   Ms. Maria Parsons is a 64 yo woman with multiple medical problems including advanced MS, nonverbal and bedbound at baseline, chronic stage IV sacral decubiti, HTN. Pt was sent to the ER from the wound center due to respiratory distress and "gurgling". She was admitted on 03/17/12 with respiratory failure and possible dysphagia/aspiration and SIRS likely from sacral wound. Also noted to have anemia and dehydration.  At present, pt is lying in bed, nonverbal but will nod head to questions. has recently started following patient in the home as well.    PFSH:   Family History noncontributory    Social History noncontributory   Review of Systems:   Review of Systems   Not able to obtain as patient nonverbal  NURSING NOTES: **Vital Signs.:   26-Sep-13 09:41    Vital Signs Type: Routine    Temperature Temperature (F): 97    Celsius: 36.1    Temperature Source: axillary    Pulse Pulse: 100    Respirations Respirations: 20    Systolic BP Systolic BP: 108    Diastolic BP (mmHg) Diastolic BP (mmHg): 66    Mean BP: 80    Pulse Ox % Pulse Ox %: 96    Pulse Ox Activity Level: At rest    Oxygen Delivery: Room Air/ 21 %   Physical Exam:   General Cachectic female in no acute distress    Lungs: rhonchi    Cardiac: regular rate, rhythm    Breast: not examined    Abdomen: positive bowel sounds    Skin: wound  stage IV decubitus    Neuro: nonverbal    No Known Allergies:   Assessment and Plan:  Impression:   Ms. Maria Parsons is a 64 yo woman with multiple medical problems including advanced MS, nonverbal and bedbound at baseline, chronic stage IV sacral decubiti, HTN with normocytic anemia likely multifactorial. Anemia secoandary to malnutrition,chronic inflammation as well as renal impairment.  Plan:   Would not recommend intravenous Iron infusion in this situation. If necessary  would transfuse patient.consult.contact us if any additional queries.  Electronic Signatures: Antony Hasteamiah, Jaslene Marsteller S (MD)  (Signed 30-Sep-13 10:26)  Authored: HISTORY OF PRESENT ILLNESS, PFSH, ROS, NURSING NOTES, PE, ALLERGIES, ASSESSMENT AND PLAN   Last Updated: 30-Sep-13 10:26 by Antony Hasteamiah, Germaine Shenker S (MD)

## 2014-10-10 NOTE — Consult Note (Signed)
Comments   Dr Phifer and I had a lengthy meeting with pt's husband and son. We again discussed options for continued care including probable need for PEG insertion vs comfort feedings and hospice care. Family says they have talked with patient and made the decision to pursue PEG. They want to take patient home with hospice care. Family understands that patient is approaching end of life. They are making decisions necessary to maintain needed care in the home including ensuring that patient has 24/7 caregivers. They understand that patient may need the Hospice Home in the future and that they can also utilize respite services via hospice.  discussed with CM who will contact [facility] to ensure they can facilitate wound vac and artificial nutrition in the home. Will have CM speak to family regarding details.  will discuss with attending. Will need GI consult for PEG.  45 minutes   Electronic Signatures for Addendum Section:  Phifer, Izora Gala (MD) (Signed Addendum 01-Oct-13 12:57)  Billey Chang, NP, and I met with pt's husband and son. Agree with assessment and plan as outlined in above note.   Electronic Signatures: Borders, Kirt Boys (NP)  (Signed 01-Oct-13 09:54)  Authored: Palliative Care   Last Updated: 01-Oct-13 12:57 by Phifer, Izora Gala (MD)

## 2014-10-10 NOTE — Consult Note (Signed)
    Comments   Josh Borders, NP, and I had a lengthy discussion with pt and her husband. We reviewed all options including PEG, SNF, home with hospice +/- PEG and Hospice Home. Pt was able to participate in discussion and make her wishes known. She eventually made the decision not to have a PEG and [facility] Husband is present and also in agreement. Ginny Ward, RN, [facility].  is getting prn IV morhpine. Will start fentanyl patch as pt cannot tolerate pills. Can use morphine elixir as prn pain medication [facility].   Electronic Signatures: Neylan Koroma, Harriett SineNancy (MD)  (Signed 02-Oct-13 12:18)  Authored: Palliative Care   Last Updated: 02-Oct-13 12:18 by Solana Coggin, Harriett SineNancy (MD)

## 2014-10-10 NOTE — Consult Note (Signed)
Brief Consult Note: Diagnosis: Anemia- normocytic.   Patient was seen by consultant.   Comments: Anemia- likely multifactorial.Patients has compromised nutrition and at present bedbound with decubitus ulcer/inflammation. Would not recommend intravenous iron therapy at this time. Would transfuse if Hb<7 if deemed necessary. Await palliative care consult.  Electronic Signatures: Antony Hasteamiah, Jessicamarie Amiri S (MD)  (Signed 26-Sep-13 13:37)  Authored: Brief Consult Note   Last Updated: 26-Sep-13 13:37 by Antony Hasteamiah, Markham Dumlao S (MD)

## 2014-10-10 NOTE — Consult Note (Signed)
PATIENT NAME:  Maria Parsons, Maria Parsons MR#:  [id number] DATE OF BIRTH:  [birth year]  DATE OF CONSULTATION:  03/24/2012  REFERRING PHYSICIAN:  Srikar R. Sudini, MD   CONSULTING PHYSICIAN:  Lurline DelShaukat Deisha Stull, MD  PRIMARY CARE PHYSICIAN: Ronna PolioJennifer Walker, MD   REASON FOR CONSULTATION: PEG tube placement.  HISTORY OF PRESENT ILLNESS: The patient is a 63 year old female, very frail, with multiple medical problems including hypertension, multiple sclerosis, chronic decubitus ulcers. She has been bedbound. The patient was sent to the Emergency Room from the [facility] Care Clinic after she was noticed to have some gurgling. She was hypoxic and tachycardic. Chest x-ray did not show any acute infiltrates. The patient was evaluated by Speech Therapy, and a modified dysphagia diet was recommended due to her showing clear aspiration with liquids. Apparently while on the dysphagia diet she aspirated again a couple of days ago and was made n.p.o. A chest x-ray is now showing what appears to be a right-sided infiltrate raising concerns about aspiration pneumonia. The patient became hypoxic again and tachycardic, although as of now her saturations are better and heart rate is better controlled as well. A feeding tube was requested by the Primary Care service due to recurrent aspiration. The patient was evaluated yesterday. The feeding tube was discussed with her. She is very weak and frail, although she appears to understand the procedure very well. She has not made a decision about a feeding tube yet and would like to discuss it further with her family. No other significant symptoms were reported by the patient, but again the patient is very weak and is unable to give a detailed history.   PAST MEDICAL HISTORY:  1. History of MS.  2. Hypertension. 3. Chronic decubitus ulcers.   SOCIAL HISTORY: She lives at home. It appears that the patient will be discharged to Hospice care.   FAMILY HISTORY: Not available.   HOME MEDICATIONS:  Home medications, according to the chart, include baclofen, enalapril, tramadol, vitamin E, Zoloft.   REVIEW OF SYSTEMS: Review of systems is hard to obtain because of the patient's extreme weakness and fatigue.   PHYSICAL EXAMINATION:  GENERAL: A very frail, thin, cachectic female. She appears very weak. She does not appear to be in any acute distress.   VITAL SIGNS: Temperature is 97.5, heart rate is 103, respirations 18 to 20, blood pressure 153/77. Oxygen saturation is about 98%.   LUNGS: A few scattered bilateral crackles.   CARDIOVASCULAR: Regular rate and rhythm. No gallops or murmur.   ABDOMEN: Mild diffuse abdominal tenderness without any rebound or guarding. No ascites or hepatosplenomegaly was noted. White cell count is 14,000, hemoglobin 9.8, hematocrit 29.5, platelet count of 399. Electrolytes are fairly unremarkable. Most recent chest x-ray shows haziness in the right infrahilar region which was read as possible atelectasis by the radiologist although could very well be an infiltrate.   ASSESSMENT AND PLAN: The patient is with very poor overall health, multiple sclerosis  and chronic decubitus ulcers. The patient was admitted with what appears to be aspiration with hypoxia and tachycardia. The patient aspirated in the hospital on modified dysphagia diet and, therefore, she has been made n.p.o. as she is considered to be high risk for recurrent aspiration. A PEG tube has been recommended by the Primary Care service. I had a discussion with the patient yesterday about placement of a feeding tube. She has not made a decision yet, and she would like to discuss it with her family before making any further decisions. The patient  overall appears to be frail and has just been recovering from episodes of significant hypoxia and tachycardia. She would be considered a moderately high risk for a surgical procedure such as a feeding tube placement, which carries the risk of further hypoxia, cardiac  arrhythmias, and even death. This will be discussed with the patient and her family; and depending on the patient's decision, further recommendations will be made.     Meanwhile. I would support her with IV hydration and consider  PPN or TPN can be considered as well. I discussed with Dr. Elpidio Anis and will follow.   ____________________________ Lurline Del, MD si:cbb D: 03/24/2012 09:14:38 ET T: 03/24/2012 09:53:10 ET JOB#: [id number]  cc: Lurline Del, MD, <Dictator> Lurline Del MD ELECTRONICALLY SIGNED 04/13/2012 17:20

## 2014-10-10 NOTE — Consult Note (Signed)
Chief Complaint:   Subjective/Chief Complaint Case discussed with palliative care. The patient has made a decision not to undergo PEG placement. Will sign off. Please call us if needed. Thanks.   Electronic Signatures: Lurline DelIftikhar, Gatsby Chismar (MD)  (Signed 02-Oct-13 17:30)  Authored: Chief Complaint   Last Updated: 02-Oct-13 17:30 by Lurline DelIftikhar, Alekhya Gravlin (MD)

## 2014-10-10 NOTE — H&P (Signed)
PATIENT NAME:  Maria Parsons, Maria Parsons MR#:  [id number] DATE OF BIRTH:  06-26-1950  DATE OF ADMISSION:  03/17/2012  PRIMARY CARE PHYSICIAN: Ronna PolioJennifer Walker, MD  The case was discussed with the ER physician, Dr. Carollee MassedKaminski. Old records have been reviewed. History obtained from the patient and her husband at bedside. Imaging studies of chest x-ray and EKG were reviewed personally.   CHIEF COMPLAINT: Shortness of breath.   HISTORY OF PRESENTING ILLNESS: The patient is a 64 year old female patient with history of hypertension, multiple sclerosis, and chronic decubitus ulcer who has been bedbound for the past one year presents to the emergency room sent in from the [facility] after she was noticed to have some gurgling. The patient was doing well at home. After she was noticed to have gurgling and the patient  was being brought to the emergency room, she also had shortness of breath, got hypoxic and had to be placed on oxygen for acute respiratory failure. The patient presently complains of no shortness of breath or chest pain. She complains of back pain. She had a soft bowel movement in the emergency room. She has been tachycardic up to 140s and 150s and presently down to 125 and is saturating 95% on room air at this time.   The patient has been bedridden secondary to multiple sclerosis for the past one year and has not been out of bed for the past two weeks. She does go to the Wound Clinic for her wound and was supposed to have debridement today. The patient has been in hospice for the past two weeks.   The patient is also anemic with hemoglobin of 8.8 with the last hemoglobin being 11.3 in June of 2013. She does not have any blood in his stools, hematemesis, or hematuria.   A chest x-ray shows no acute abnormalities. EKG shows sinus tachycardia with no acute ST-T wave changes.   PAST MEDICAL HISTORY:  1. Hypertension.  2. Multiple sclerosis. 3. Chronic decubitus ulcers. 4. Bedbound state. 5. Weight  loss.   SOCIAL HISTORY: The patient lives at home, has a home daycare taker and her husband takes care of her at night. No smoking, no alcohol, and no illicit drugs.   FAMILY HISTORY: Reviewed and unknown.   HOME MEDICATIONS: Baclofen, enalapril, tramadol, vitamin A, Zoloft, Tecfidera with dosing and schedule unknown at this time.   REVIEW OF SYSTEMS: CONSTITUTIONAL: Complains of fatigue, weakness, and weight loss. No weight gain. EYES: No blurred vision, discharge, or redness. ENT: No nasal congestion or tinnitus. CARDIOVASCULAR: No chest pain, palpitations, or edema. RESPIRATORY: Complains of shortness of breath. No wheezing or hemoptysis. No productive cough. GI: No nausea or vomiting, diarrhea, or abdominal pain. SKIN: Has decubitus ulcers. No other rash. MUSCULOSKELETAL: Has chronic low back pain. No other joint pain or joint swelling. HEMATOLOGIC: Has anemia. No easy bruising or bleeding. ENDOCRINE: No thyroid problems or diabetes or polyuria. NEUROLOGIC: Has multiple sclerosis. No dysarthria.   PHYSICAL EXAMINATION:   VITAL SIGNS: Temperature 99.5, pulse 146, respirations 28, blood pressure 112/67, and saturating 94% on room air and initially was 95% on 4 liters oxygen.   GENERAL: Frail, Caucasian female patient lying in bed comfortable, cooperative with examination, has faint wheeze.   PSYCHIATRIC: Alert and awake, poor judgment, flat affect.   HEENT: Dry oral mucosa. Pallor positive. No icterus. Pupils bilaterally equal and reactive to light. No oral ulcers or thrush.   NECK: Supple. No thyromegaly. No palpable lymph nodes. Trachea midline. No carotid bruit or  JVD.   CARDIOVASCULAR: S1 and S2 tachycardic without any murmurs. Peripheral pulses are feeble. No edema.   RESPIRATORY: Normal work of breathing. Clear to auscultation on both sides.   GASTROINTESTINAL: Soft abdomen, nontender. Bowel sounds present. No hepatosplenomegaly palpable.   SKIN: Warm and dry. Has a large  decubitus ulcer of about 10 x 10 cm with purulent discharge, foul smelling, and surrounding erythema with skin cracking around the ulcer.   MUSCULOSKELETAL: No joint swelling, redness, or effusion of the large joints. Normal muscle tone.   NEUROLOGIC: Generalized weakness all over, symmetrical. Sensation to fine touch intact all over. Cranial nerves II through XII intact.   LYMPHATIC: No cervical or inguinal lymphadenopathy.   LABS/RADIOLOGIC STUDIES: Glucose 131, BUN 26, creatinine 0.33, sodium 130, potassium 4.9, chloride 99, and bicarbonate 21. Troponin less than 0.02. WBC 11.3, hemoglobin 8.8, and platelets 412.   EKG shows sinus tachycardia with no acute ST-T wave changes.   Chest x-ray shows no acute cardiopulmonary disease.   ASSESSMENT AND PLAN:  1. Sepsis likely secondary from decubitus ulcer. No other source of infection found at this time. We will send for blood cultures. Start on vancomycin and Zosyn. We will consult surgery as the patient might need debridement of the wound. We will also consult the wound team. I discussed with the husband regarding the aggressiveness of management. He does feel that the patient will need aggressive care at this time, but if she needs any procedures or surgeries he will relook at the management plan. At the time of discharge, the patient will be discharged back on home hospice.  2. Acute respiratory failure. This has been associated with severe tachycardia which could be secondary to sepsis. Presently the patient is on room air doing better but has been bedbound with decubitus ulcer. We will get a CT scan of the chest to rule out pulmonary embolism as the chest x-ray is completely normal and there are no signs of any aspiration and with no history of dysphagia. We will get two more sets of cardiac enzymes. 3. Normocytic anemia. This seems to be chronic anemia secondary to chronic disease. Last known hemoglobin was 11.3 in June. Presently her hemoglobin  is 8.3. This is expected to fall as the patient is dehydrated and we are giving her IV fluids. We will get iron studies and follow. The patient will not be a good candidate for any colonoscopy or EGD. We will not check any stool for Hemoccult at this time.  4. Dehydration likely secondary to decreased intake and also in sepsis. Aggressive IV fluid resuscitation.  5. Multiple sclerosis. The case was discussed with neurology PA and Dr. Sherryll Burger. No steroids advised.  6. Hyponatremia secondary to dehydration.  7. Hypertension. The patient is currently not on any antihypertensives and blood pressure is in the normal range, needs to be monitored.  8. Deep vein thrombosis prophylaxis with Lovenox.   CODE STATUS: DNR/DNI as discussed with the patient and her husband at bedside.   TIME SPENT: Time spent today on this case was more than 75 minutes with greater than 50% time spent in coordination of care.  ____________________________ Molinda Bailiff. Zaidyn Claire, MD srs:slb D: 03/17/2012 16:42:48 ET T: 03/17/2012 17:06:36 ET JOB#: [id number]  cc: Wardell Heath R. Lexi Conaty, MD, <Dictator> Ginette Pitman. Dan Humphreys, MD Hemang K. Sherryll Burger, MD Orie Fisherman MD ELECTRONICALLY SIGNED 03/17/2012 18:54

## 2014-10-10 NOTE — Op Note (Signed)
PATIENT NAME:  Maria Parsons, Shulamis MR#:  [id number] DATE OF BIRTH:  08/26/1950  DATE OF PROCEDURE:  03/20/2012  PREOPERATIVE DIAGNOSIS: Sacral decubitus.   POSTOPERATIVE DIAGNOSIS: Sacral decubitus.  PROCEDURE PERFORMED: Incision and drainage with debridement and wound VAC placement.   SURGEON: Quentin Orealph L. Ely, III, MD   ANESTHESIA: General.    OPERATIVE PROCEDURE: With the patient under general anesthesia, she was placed in the prone position, appropriately padded and positioned. The perineal area was prepped with Betadine and draped with sterile towels. Necrotic material was ellipsed and removed without difficulty. There was no undrained abscess. The sacral fascia was debrided but bone was not exposed. The area was copiously irrigated. A wound VAC was placed using Ioban as the bottom layer and gray foam. A satisfactory seal was obtained. The patient was awakened and returned to the recovery room having tolerated the procedure well. Sponge, instrument, and needle counts were correct x2 in the Operating Room.  ____________________________ Quentin Orealph L. Ely III, MD rle:cbb D: 03/20/2012 09:05:09 ET T: 03/20/2012 12:25:12 ET JOB#: [id number]  cc: Carmie Endalph L. Ely III, MD, <Dictator> Ginette PitmanJennifer A. Dan HumphreysWalker, MD Quentin OreALPH L ELY MD ELECTRONICALLY SIGNED 03/21/2012 16:26

## 2014-10-10 NOTE — Discharge Summary (Signed)
PATIENT NAME:  Maria CuminsBASS, Mardie MR#:  [id number] DATE OF BIRTH:  January 03, 1951  DATE OF ADMISSION:  03/17/2012 DATE OF DISCHARGE:  03/25/2012  PRIMARY CARE PHYSICIAN: Ronna PolioJennifer Walker, MD   TYPE OF DISCHARGE: Discharged to Hospice home    DISCHARGE DIAGNOSES:  1. Acute respiratory failure.  2. Aspiration pneumonia.  3. Dysphagia.  4. Sepsis.  5. Sacral decubitus ulcer.  6. Normocytic anemia status post 1 unit blood transfusion.  7. Dehydration.  8. Multiple sclerosis, advanced.  9. Hyponatremia.  10. Hypertension.   CONSULTANTS:  1. Dr. Niel HummerIftikhar of GI  2. Dr. Harvie JuniorPhifer of Palliative Care   3. Dr. Michela PitcherEly of Surgery    PROCEDURE: Surgical department of the sacral decubitus ulcer status post Wound VAC placement.   ADMITTING HISTORY AND PHYSICAL: Please see detailed history and physical dictated on 03/17/2012. In brief, this is a 64 year old female patient with advanced MS who is bedbound with chronic decubitus ulcer in the sacral area who presented to the Emergency Room after she was noticed to have some gurgling, shortness of breath, and hypoxia. The patient was saturating in the low 80's, tachycardic in the 140's, and admitted to the hospitalist service for further management with sepsis secondary to decubitus ulcer and acute respiratory failure.    HOSPITAL COURSE:  1. Sacral decubitus ulcer. The patient had purulent discharge with a large ulcer of 10 x 10 cm. Surgery was consulted for debridement and the patient had debridement by Dr. Michela PitcherEly. A Wound VAC was placed. Initially the patient and family did not want debridement but later agreed on it. The patient was seen by Palliative Care, Dr. Harvie JuniorPhifer. The patient has been on vancomycin and Zosyn for eight days during the hospital stay which will be discontinued.  2. Dysphagia. The patient had dysphagia of mild degree in the past. This seemed to worsen. The patient was on a pureed diet with thickened liquids with caution during the hospital stay but had  an episode of acute respiratory failure secondary to aspiration and was made n.p.o. After long discussions with the family involving Palliative Care and GI, the patient and the family decided that she would not want a PEG tube. The patient is being discharged to Hospice facility on comfort food, on Roxanol for pain along with fentanyl and Ativan.   On the day of discharge, the patient is awake, afebrile, heart rate of 102, blood pressure 159/75, and has a sacral decubitus ulcer with a Wound VAC on it and is being discharged to Hospice facility.   DISCHARGE MEDICATIONS:  1. Fentanyl 25 mcg patch every three days.  2. Roxanol 20 mg/mL 0.25 to 0.5 mL oral or sublingual every 1 to 2 hours for pain or dyspnea.  3. Lorazepam 0.5 to 1 mg p.o. sublingual every 2 to 4 hours as needed.  4. Ranitidine 150 mg oral twice a day.   DISCHARGE INSTRUCTIONS: The patient will be followed by Hospice facility and physician. She can be on comfort food but the patient is high risk for aspiration and the family understands this.   This plan was discussed with the patient and husband at bedside who verbalized understanding and are okay with the plan.   TIME SPENT: Time spent today on this discharge dictation along with coordinating care and counseling of the patient and family was 40 minutes.   ____________________________ Molinda BailiffSrikar R. Azhar Knope, MD srs:drc D: 03/25/2012 10:52:50 ET T: 03/25/2012 13:12:57 ET JOB#: [id number]  cc: Wardell HeathSrikar R. Pama Roskos, MD, <Dictator>, Ginette PitmanJennifer A. Dan HumphreysWalker, MD Wardell HeathSRIKAR  [place] MD ELECTRONICALLY SIGNED Apr 09, 2012 7:46

## 2014-10-10 NOTE — Consult Note (Signed)
Brief Consult Note: Diagnosis: Oropharyngeal dysphagia.   Patient was seen by consultant.   Comments: Oropharyngeal dysphagia with probable aspiration despite dietary modification. PEG was discussed with her but she would like some time to discuss it with her husband. Very frail and high risk for perioperative complications.  Will re-evaluate tomorrow and discuss with her husband. Further recommendations to follow.  Electronic Signatures: Lurline DelIftikhar, Ludie Hudon (MD)  (Signed 01-Oct-13 18:26)  Authored: Brief Consult Note   Last Updated: 01-Oct-13 18:26 by Lurline DelIftikhar, Tarquin Welcher (MD)
